# Patient Record
Sex: Female | Born: 1940 | Race: Black or African American | Hispanic: No | Marital: Married | Smoking: Former smoker
Health system: Southern US, Community
[De-identification: ages and names within clinical notes are randomized; demographics above are authoritative.]

## PROBLEM LIST (undated history)

## (undated) DIAGNOSIS — M1990 Unspecified osteoarthritis, unspecified site: Secondary | ICD-10-CM

## (undated) DIAGNOSIS — E079 Disorder of thyroid, unspecified: Secondary | ICD-10-CM

## (undated) DIAGNOSIS — E785 Hyperlipidemia, unspecified: Secondary | ICD-10-CM

## (undated) DIAGNOSIS — T7840XA Allergy, unspecified, initial encounter: Secondary | ICD-10-CM

## (undated) DIAGNOSIS — C801 Malignant (primary) neoplasm, unspecified: Secondary | ICD-10-CM

## (undated) DIAGNOSIS — D649 Anemia, unspecified: Secondary | ICD-10-CM

## (undated) DIAGNOSIS — F419 Anxiety disorder, unspecified: Secondary | ICD-10-CM

## (undated) DIAGNOSIS — D689 Coagulation defect, unspecified: Secondary | ICD-10-CM

## (undated) HISTORY — PX: BUNIONECTOMY: SHX129

## (undated) HISTORY — DX: Disorder of thyroid, unspecified: E07.9

## (undated) HISTORY — DX: Hyperlipidemia, unspecified: E78.5

## (undated) HISTORY — DX: Anemia, unspecified: D64.9

## (undated) HISTORY — DX: Malignant (primary) neoplasm, unspecified: C80.1

## (undated) HISTORY — DX: Coagulation defect, unspecified: D68.9

## (undated) HISTORY — DX: Anxiety disorder, unspecified: F41.9

## (undated) HISTORY — DX: Allergy, unspecified, initial encounter: T78.40XA

## (undated) HISTORY — DX: Unspecified osteoarthritis, unspecified site: M19.90

## (undated) HISTORY — PX: ABDOMINAL HYSTERECTOMY: SUR658

---

## 2011-11-25 HISTORY — PX: THYROIDECTOMY: SHX17

## 2012-10-28 ENCOUNTER — Other Ambulatory Visit (HOSPITAL_COMMUNITY): Payer: Self-pay | Admitting: Internal Medicine

## 2012-10-28 DIAGNOSIS — C73 Malignant neoplasm of thyroid gland: Secondary | ICD-10-CM

## 2012-11-09 ENCOUNTER — Encounter (HOSPITAL_COMMUNITY)
Admission: RE | Admit: 2012-11-09 | Discharge: 2012-11-09 | Disposition: A | Discharge status: Home / Self Care | Payer: Medicare Other | Source: Ambulatory Visit | Attending: Internal Medicine | Admitting: Internal Medicine

## 2012-11-09 DIAGNOSIS — C73 Malignant neoplasm of thyroid gland: Secondary | ICD-10-CM | POA: Insufficient documentation

## 2012-11-09 MED ORDER — THYROTROPIN ALFA 1.1 MG IM SOLR
0.9000 mg | INTRAMUSCULAR | Status: AC
  Administered 2012-11-09: 0.9 mg via INTRAMUSCULAR
  Filled 2012-11-09: qty 0.9

## 2012-11-10 ENCOUNTER — Encounter (HOSPITAL_COMMUNITY)
Admission: RE | Admit: 2012-11-10 | Discharge: 2012-11-10 | Disposition: A | Discharge status: Home / Self Care | Payer: Medicare Other | Source: Ambulatory Visit | Attending: Internal Medicine | Admitting: Internal Medicine

## 2012-11-10 MED ORDER — THYROTROPIN ALFA 1.1 MG IM SOLR
0.9000 mg | INTRAMUSCULAR | Status: AC
  Administered 2012-11-10: 0.9 mg via INTRAMUSCULAR
  Filled 2012-11-10: qty 0.9

## 2012-11-11 ENCOUNTER — Encounter (HOSPITAL_COMMUNITY)
Admission: RE | Admit: 2012-11-11 | Discharge: 2012-11-11 | Disposition: A | Discharge status: Home / Self Care | Payer: Medicare Other | Source: Ambulatory Visit | Attending: Internal Medicine | Admitting: Internal Medicine

## 2012-11-11 MED ORDER — SODIUM IODIDE I 131 CAPSULE
80.2000 | Freq: Once | INTRAVENOUS | Status: AC | PRN
  Administered 2012-11-11: 80.2 via ORAL

## 2012-11-19 ENCOUNTER — Encounter (HOSPITAL_COMMUNITY)
Admission: RE | Admit: 2012-11-19 | Discharge: 2012-11-19 | Disposition: A | Discharge status: Home / Self Care | Payer: Medicare Other | Source: Ambulatory Visit | Attending: Internal Medicine | Admitting: Internal Medicine

## 2012-11-19 DIAGNOSIS — C73 Malignant neoplasm of thyroid gland: Secondary | ICD-10-CM | POA: Insufficient documentation

## 2014-01-09 ENCOUNTER — Encounter (HOSPITAL_COMMUNITY)
Admission: RE | Admit: 2014-01-09 | Discharge: 2014-01-09 | Disposition: A | Discharge status: Home / Self Care | Payer: Medicare Other | Source: Ambulatory Visit | Attending: Internal Medicine | Admitting: Internal Medicine

## 2014-01-09 ENCOUNTER — Other Ambulatory Visit (HOSPITAL_COMMUNITY): Payer: Self-pay | Admitting: Internal Medicine

## 2014-01-09 DIAGNOSIS — C73 Malignant neoplasm of thyroid gland: Secondary | ICD-10-CM

## 2014-01-09 MED ORDER — THYROTROPIN ALFA 1.1 MG IM SOLR
0.9000 mg | INTRAMUSCULAR | Status: AC
  Administered 2014-01-09: 0.9 mg via INTRAMUSCULAR

## 2014-01-10 ENCOUNTER — Encounter (HOSPITAL_COMMUNITY): Payer: Medicare Other | Attending: Internal Medicine

## 2014-01-10 DIAGNOSIS — C73 Malignant neoplasm of thyroid gland: Secondary | ICD-10-CM | POA: Insufficient documentation

## 2014-01-10 MED ORDER — THYROTROPIN ALFA 1.1 MG IM SOLR
0.9000 mg | INTRAMUSCULAR | Status: AC
  Administered 2014-01-10: 0.9 mg via INTRAMUSCULAR

## 2014-01-11 ENCOUNTER — Encounter (HOSPITAL_COMMUNITY)
Admission: RE | Admit: 2014-01-11 | Discharge: 2014-01-11 | Disposition: A | Discharge status: Home / Self Care | Payer: Medicare Other | Source: Ambulatory Visit | Attending: Internal Medicine | Admitting: Internal Medicine

## 2014-01-11 DIAGNOSIS — C73 Malignant neoplasm of thyroid gland: Secondary | ICD-10-CM | POA: Insufficient documentation

## 2014-01-13 ENCOUNTER — Encounter (HOSPITAL_COMMUNITY)
Admission: RE | Admit: 2014-01-13 | Discharge: 2014-01-13 | Disposition: A | Discharge status: Home / Self Care | Payer: Medicare Other | Source: Ambulatory Visit | Attending: Internal Medicine | Admitting: Internal Medicine

## 2014-01-13 DIAGNOSIS — C73 Malignant neoplasm of thyroid gland: Secondary | ICD-10-CM | POA: Insufficient documentation

## 2014-01-13 LAB — THYROGLOBULIN LEVEL: Thyroglobulin: <0.2 ng/mL (ref 0.0–55.0)

## 2014-01-13 LAB — THYROGLOBULIN ANTIBODY

## 2020-01-24 ENCOUNTER — Encounter: Payer: Self-pay | Admitting: Gastroenterology

## 2020-03-06 ENCOUNTER — Encounter: Payer: Medicare Other | Admitting: Gastroenterology

## 2020-03-26 ENCOUNTER — Other Ambulatory Visit: Payer: Self-pay

## 2020-03-26 ENCOUNTER — Ambulatory Visit (AMBULATORY_SURGERY_CENTER): Payer: Self-pay | Admitting: *Deleted

## 2020-03-26 VITALS — Temp 97.6°F | Ht 66.0 in | Wt 137.0 lb

## 2020-03-26 DIAGNOSIS — Z86010 Personal history of colonic polyps: Secondary | ICD-10-CM

## 2020-03-26 NOTE — Progress Notes (Signed)

## 2020-04-09 ENCOUNTER — Encounter: Payer: Medicare Other | Admitting: Gastroenterology

## 2020-05-10 ENCOUNTER — Encounter: Payer: Self-pay | Admitting: Certified Registered Nurse Anesthetist

## 2020-05-11 ENCOUNTER — Ambulatory Visit (AMBULATORY_SURGERY_CENTER): Payer: Medicare Other | Admitting: Gastroenterology

## 2020-05-11 ENCOUNTER — Other Ambulatory Visit: Payer: Self-pay

## 2020-05-11 ENCOUNTER — Encounter: Payer: Self-pay | Admitting: Gastroenterology

## 2020-05-11 VITALS — BP 172/94 | HR 69 | Temp 96.9°F | Resp 16 | Ht 66.0 in | Wt 138.0 lb

## 2020-05-11 DIAGNOSIS — Z86010 Personal history of colonic polyps: Secondary | ICD-10-CM | POA: Diagnosis not present

## 2020-05-11 HISTORY — PX: COLONOSCOPY: SHX174

## 2020-05-11 MED ORDER — SODIUM CHLORIDE 0.9 % IV SOLN: 500.0000 mL | Freq: Once | INTRAVENOUS | Status: AC

## 2020-05-11 NOTE — Patient Instructions (Signed)
Discharge instructions given. Handouts on diverticulosis and hemorrhoids. Resume previous medications. YOU HAD AN ENDOSCOPIC PROCEDURE TODAY AT THE [facility]:   Refer to the procedure report that was given to you for any specific questions about what was found during the examination.  If the procedure report does not answer your questions, please call your gastroenterologist to clarify.  If you requested that your care partner not be given the details of your procedure findings, then the procedure report has been included in a sealed envelope for you to review at your convenience later.  YOU SHOULD EXPECT: Some feelings of bloating in the abdomen. Passage of more gas than usual.  Walking can help get rid of the air that was put into your GI tract during the procedure and reduce the bloating. If you had a lower endoscopy (such as a colonoscopy or flexible sigmoidoscopy) you may notice spotting of blood in your stool or on the toilet paper. If you underwent a bowel prep for your procedure, you may not have a normal bowel movement for a few days.  Please Note:  You might notice some irritation and congestion in your nose or some drainage.  This is from the oxygen used during your procedure.  There is no need for concern and it should clear up in a day or so.  SYMPTOMS TO REPORT IMMEDIATELY:   Following lower endoscopy (colonoscopy or flexible sigmoidoscopy):  Excessive amounts of blood in the stool  Significant tenderness or worsening of abdominal pains  Swelling of the abdomen that is new, acute  Fever of 100F or higher   For urgent or emergent issues, a gastroenterologist can be reached at any hour by calling [phone]. Do not use MyChart messaging for urgent concerns.    DIET:  We do recommend a small meal at first, but then you may proceed to your regular diet.  Drink plenty of fluids but you should avoid alcoholic beverages for 24 hours.  ACTIVITY:  You should plan to  take it easy for the rest of today and you should NOT DRIVE or use heavy machinery until tomorrow (because of the sedation medicines used during the test).    FOLLOW UP: Our staff will call the number listed on your records 48-72 hours following your procedure to check on you and address any questions or concerns that you may have regarding the information given to you following your procedure. If we do not reach you, we will leave a message.  We will attempt to reach you two times.  During this call, we will ask if you have developed any symptoms of COVID 19. If you develop any symptoms (ie: fever, flu-like symptoms, shortness of breath, cough etc.) before then, please call [phone].  If you test positive for Covid 19 in the 2 weeks post procedure, please call and report this information to us.    If any biopsies were taken you will be contacted by phone or by letter within the next 1-3 weeks.  Please call us at [phone] if you have not heard about the biopsies in 3 weeks.    SIGNATURES/CONFIDENTIALITY: You and/or your care partner have signed paperwork which will be entered into your electronic medical record.  These signatures attest to the fact that that the information above on your After Visit Summary has been reviewed and is understood.  Full responsibility of the confidentiality of this discharge information lies with you and/or your care-partner. 

## 2020-05-11 NOTE — Progress Notes (Signed)
Report given to PACU, vss 

## 2020-05-11 NOTE — Op Note (Signed)
[facility] Patient Name: Katherine David Procedure Date: 05/11/2020 10:46 AM MRN: [id number] Endoscopist: Jackquline Denmark , MD Age: 79 Referring MD:  Date of Birth: [birth year] Gender: Female Account #: [id number] Procedure:                Colonoscopy Indications:              High risk colon cancer surveillance: Personal                            history of colonic polyps Medicines:                Monitored Anesthesia Care Procedure:                Pre-Anesthesia Assessment:                           - Prior to the procedure, a History and Physical                            was performed, and patient medications and                            allergies were reviewed. The patient's tolerance of                            previous anesthesia was also reviewed. The risks                            and benefits of the procedure and the sedation                            options and risks were discussed with the patient.                            All questions were answered, and informed consent                            was obtained. Prior Anticoagulants: The patient has                            taken no previous anticoagulant or antiplatelet                            agents. ASA Grade Assessment: II - A patient with                            mild systemic disease. After reviewing the risks                            and benefits, the patient was deemed in                            satisfactory condition to undergo the procedure.  After obtaining informed consent, the colonoscope                            was passed under direct vision. Throughout the                            procedure, the patient's blood pressure, pulse, and                            oxygen saturations were monitored continuously. The                            Colonoscope was introduced through the anus and                            advanced to the the cecum, identified  by                            appendiceal orifice and ileocecal valve. The                            colonoscopy was performed without difficulty. The                            patient tolerated the procedure well. The quality                            of the bowel preparation was good. The ileocecal                            valve, appendiceal orifice, and rectum were                            photographed. Scope In: 10:50:49 AM Scope Out: 11:07:23 AM Scope Withdrawal Time: 0 hours 10 minutes 48 seconds  Total Procedure Duration: 0 hours 16 minutes 34 seconds  Findings:                 A few small-mouthed diverticula were found in the                            sigmoid colon and ascending colon.                           Non-bleeding internal hemorrhoids were found during                            retroflexion. The hemorrhoids were small.                           The exam was otherwise without abnormality on                            direct and retroflexion views. Complications:            No immediate complications.  Estimated Blood Loss:     Estimated blood loss: none. Impression:               -Mild pancolonic diverticulosis.                           -Non-bleeding internal hemorrhoids.                           -Otherwise normal colonoscopy. Recommendation:           - Patient has a contact number available for                            emergencies. The signs and symptoms of potential                            delayed complications were discussed with the                            patient. Return to normal activities tomorrow.                            Written discharge instructions were provided to the                            patient.                           - Resume previous diet.                           - Continue present medications.                           - No repeat colonoscopy due to age.                           - Return to GI clinic PRN.                            - The findings and recommendations were discussed                            with the patient's family. Jackquline Denmark, MD 05/11/2020 11:11:46 AM This report has been signed electronically.

## 2020-05-11 NOTE — Progress Notes (Signed)
Pt's states no medical or surgical changes since previsit or office visit.  ° °Vitals CW °

## 2020-05-15 ENCOUNTER — Telehealth: Payer: Self-pay

## 2020-05-15 NOTE — Telephone Encounter (Signed)
  Follow up Call-  Call back number 05/11/2020  Post procedure Call Back phone  # [phone]  Permission to leave phone message Yes  Some recent data might be hidden     Patient questions:  Do you have a fever, pain , or abdominal swelling? No. Pain Score  0 *  Have you tolerated food without any problems? Yes.    Have you been able to return to your normal activities? Yes.    Do you have any questions about your discharge instructions: Diet   No. Medications  No. Follow up visit  No.  Do you have questions or concerns about your Care? No.  Actions: * If pain score is 4 or above: No action needed, pain <4.   1. Have you developed a fever since your procedure? No   2.   Have you had an respiratory symptoms (SOB or cough) since your procedure? No   3.   Have you tested positive for COVID 19 since your procedure? No   4.   Have you had any family members/close contacts diagnosed with the COVID 19 since your procedure?  No    If yes to any of these questions please route to Joylene John, RN and Erenest Rasher, RN
